# Patient Record
Sex: Female | Born: 1995 | Race: Black or African American | Hispanic: No | Marital: Single | Smoking: Never smoker
Health system: Southern US, Community
[De-identification: ages and names within clinical notes are randomized; demographics above are authoritative.]

## PROBLEM LIST (undated history)

## (undated) DIAGNOSIS — L503 Dermatographic urticaria: Secondary | ICD-10-CM

## (undated) HISTORY — DX: Dermatographic urticaria: L50.3

---
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
## 2019-02-04 ENCOUNTER — Other Ambulatory Visit: Payer: Self-pay

## 2019-02-04 DIAGNOSIS — Z20822 Contact with and (suspected) exposure to covid-19: Secondary | ICD-10-CM

## 2019-02-06 LAB — NOVEL CORONAVIRUS, NAA: SARS-CoV-2, NAA: NOT DETECTED

## 2019-02-21 ENCOUNTER — Other Ambulatory Visit: Payer: Self-pay

## 2019-02-21 DIAGNOSIS — Z20822 Contact with and (suspected) exposure to covid-19: Secondary | ICD-10-CM

## 2019-02-23 LAB — NOVEL CORONAVIRUS, NAA: SARS-CoV-2, NAA: NOT DETECTED

## 2023-04-17 ENCOUNTER — Encounter (HOSPITAL_COMMUNITY): Payer: Self-pay | Admitting: Emergency Medicine

## 2023-04-17 ENCOUNTER — Emergency Department (HOSPITAL_COMMUNITY)
Admission: EM | Admit: 2023-04-17 | Discharge: 2023-04-18 | Discharge status: Against Medical Advice | Payer: BLUE CROSS/BLUE SHIELD | Attending: Emergency Medicine | Admitting: Emergency Medicine

## 2023-04-17 DIAGNOSIS — Z5321 Procedure and treatment not carried out due to patient leaving prior to being seen by health care provider: Secondary | ICD-10-CM | POA: Diagnosis not present

## 2023-04-17 DIAGNOSIS — Z20822 Contact with and (suspected) exposure to covid-19: Secondary | ICD-10-CM | POA: Insufficient documentation

## 2023-04-17 DIAGNOSIS — R059 Cough, unspecified: Secondary | ICD-10-CM | POA: Diagnosis not present

## 2023-04-17 DIAGNOSIS — R519 Headache, unspecified: Secondary | ICD-10-CM | POA: Insufficient documentation

## 2023-04-17 DIAGNOSIS — R509 Fever, unspecified: Secondary | ICD-10-CM | POA: Insufficient documentation

## 2023-04-17 DIAGNOSIS — R110 Nausea: Secondary | ICD-10-CM | POA: Insufficient documentation

## 2023-04-17 LAB — RESP PANEL BY RT-PCR (RSV, FLU A&B, COVID)  RVPGX2
Influenza A by PCR: NEGATIVE
Influenza B by PCR: NEGATIVE
Resp Syncytial Virus by PCR: NEGATIVE
SARS Coronavirus 2 by RT PCR: NEGATIVE

## 2023-04-17 NOTE — ED Triage Notes (Signed)
PT woke feeling poorly. Fever earlier today. Has not taken anything for it. Scratchy throat. Headache, mild nausea, denies vomiting. Has cough that is productive.
